# Patient Record
Sex: Female | Born: 1960 | Race: White | Hispanic: No | Marital: Married | Smoking: Never smoker
Health system: Southern US, Community
[De-identification: ages and names within clinical notes are randomized; demographics above are authoritative.]

---

## 1997-09-29 ENCOUNTER — Other Ambulatory Visit: Admission: RE | Admit: 1997-09-29 | Discharge: 1997-09-29 | Payer: Self-pay | Admitting: Obstetrics and Gynecology

## 1999-01-01 ENCOUNTER — Other Ambulatory Visit: Admission: RE | Admit: 1999-01-01 | Discharge: 1999-01-01 | Payer: Self-pay | Admitting: Obstetrics and Gynecology

## 1999-10-18 ENCOUNTER — Other Ambulatory Visit: Admission: RE | Admit: 1999-10-18 | Discharge: 1999-10-18 | Payer: Self-pay | Admitting: Obstetrics and Gynecology

## 1999-12-26 ENCOUNTER — Encounter (INDEPENDENT_AMBULATORY_CARE_PROVIDER_SITE_OTHER): Payer: Self-pay | Admitting: Specialist

## 1999-12-26 ENCOUNTER — Ambulatory Visit (HOSPITAL_COMMUNITY): Admission: RE | Admit: 1999-12-26 | Discharge: 1999-12-26 | Payer: Self-pay | Admitting: Obstetrics and Gynecology

## 2000-12-02 ENCOUNTER — Other Ambulatory Visit: Admission: RE | Admit: 2000-12-02 | Discharge: 2000-12-02 | Payer: Self-pay | Admitting: Obstetrics and Gynecology

## 2002-02-09 ENCOUNTER — Other Ambulatory Visit: Admission: RE | Admit: 2002-02-09 | Discharge: 2002-02-09 | Payer: Self-pay | Admitting: Obstetrics and Gynecology

## 2003-03-02 ENCOUNTER — Other Ambulatory Visit: Admission: RE | Admit: 2003-03-02 | Discharge: 2003-03-02 | Payer: Self-pay | Admitting: Obstetrics and Gynecology

## 2003-12-10 ENCOUNTER — Emergency Department (HOSPITAL_COMMUNITY): Admission: EM | Admit: 2003-12-10 | Discharge: 2003-12-10 | Payer: Self-pay | Admitting: *Deleted

## 2004-03-11 ENCOUNTER — Other Ambulatory Visit: Admission: RE | Admit: 2004-03-11 | Discharge: 2004-03-11 | Payer: Self-pay | Admitting: Obstetrics and Gynecology

## 2004-03-23 ENCOUNTER — Emergency Department (HOSPITAL_COMMUNITY): Admission: EM | Admit: 2004-03-23 | Discharge: 2004-03-23 | Payer: Self-pay | Admitting: Family Medicine

## 2005-03-26 ENCOUNTER — Other Ambulatory Visit: Admission: RE | Admit: 2005-03-26 | Discharge: 2005-03-26 | Payer: Self-pay | Admitting: Obstetrics and Gynecology

## 2007-08-19 ENCOUNTER — Emergency Department (HOSPITAL_COMMUNITY): Admission: EM | Admit: 2007-08-19 | Discharge: 2007-08-19 | Payer: Self-pay | Admitting: Emergency Medicine

## 2010-05-27 ENCOUNTER — Encounter
Admission: RE | Admit: 2010-05-27 | Discharge: 2010-05-27 | Payer: Self-pay | Source: Home / Self Care | Attending: Allergy | Admitting: Allergy

## 2010-10-04 NOTE — H&P (Signed)
[facility]  Patient:    Madison Duncan, Madison Duncan                        MRN: [id number] Adm. Date:  [id number] Attending:  Frederich Balding                         History and Physical  HISTORY OF PRESENT ILLNESS:  A 50 year old gravida 1, para 1, married white female, who presents for a hysteroscopy as well as diagnostic laparoscopy for evaluation of abnormal uterine bleeding and pelvic discomfort.  In relation to the present admission.  First of all, the patient has been on birth control pills in the form of Lo-Ovral.  She has described increasing pelvic pain and discomfort.  She describes as shooting mid-lower abdominal pain, which will last several seconds and occurs on a daily basis.  Associated with this is a more persistent right lower quadrant tenderness.  She is also having increasing problems with deep dyspareunia that has become extremely limiting. She has had three previous laparoscopies in the past.  The first two were done for a fertility evaluation with findings of endometriosis.  The last one we did was in 1998 with negative findings except for some minimal pelvic adhesive processes.  She is also having some intermittent bleeding difficulties on the birth control pills.  We have treated her with a course of antibiotics. Ultrasound evaluation has been negative.  ALLERGIES:  No known drug allergies.  MEDICATIONS:  Birth control pills and Effexor.  PAST MEDICAL HISTORY:  Usual childhood diseases.  She does have bilateral breast implants put in surgically, and she has had three previous diagnostic laparoscopies, one previous spontaneous vaginal delivery.  FAMILY HISTORY:  Noncontributory.  SOCIAL HISTORY:  No tobacco or alcohol use.  REVIEW OF SYSTEMS:  Noncontributory.  PHYSICAL EXAMINATION:  VITAL SIGNS:  The patient is afebrile with stable vital signs.  HEENT:  Normocephalic.  Pupils equal, round and reactive to light and accommodation.   Extraocular movements were intact.  Oropharynx clear.  NECK:  Without thyromegaly.  BREASTS:  Without any discrete masses.  LUNGS:  Clear.  CARDIOVASCULAR:  Regular rhythm and rate without murmurs or gallops.  ABDOMEN:  Benign.  PELVIC:  Normal external genitalia.  Vaginal mucosa clear.  Cervix unremarkable.  Uterus normal size, shape, and contour.  Adnexa free of any masses or tenderness.  EXTREMITIES:  Trace edema.  NEUROLOGIC:  Grossly within normal limits.  IMPRESSION:  Pelvic pain, abnormal bleeding.  Rule out pelvic pathology.  PLAN:  The patient will undergo a hysteroscopy along with diagnostic laparoscopy on standby.  The risks of surgery have been discussed, including the risks of anesthesia.  The risk of incisional infection or bleeding.  The risk of vascular injury that could require transfusion with associated risk of AIDS or hepatitis.  The risk of injury to adjacent organs that could require further exploratory surgery.  The risks of deep venous thrombosis or pulmonary embolus.  The patient expressed an understanding of the indications and risks. DD:  12/26/99 TD:  12/26/99 Job: [id number]

## 2010-10-04 NOTE — Op Note (Signed)
[facility]  Patient:    Madison Duncan, Madison Duncan                        MRN: [id number] Proc. Date: 12/26/99 Adm. Date:  [id number] Attending:  Frederich Balding                           Operative Report  PREOPERATIVE DIAGNOSIS:  Pelvic pain.  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  PROCEDURE:  Hysteroscopy, D&C, and open diagnostic laparoscopy.  SURGEON:  Juluis Mire, M.D.  ANESTHESIA:  General endotracheal.  ESTIMATED BLOOD LOSS:  Minimal.  PACKS AND DRAINS:  None.  INTRAOPERATIVE BLOOD PLACED:  None.  COMPLICATIONS:  None.  INDICATIONS:  Are dictated in History and Physical.  DESCRIPTION OF PROCEDURE:  The patient was taken to the OR and placed in the supine position.  After a satisfactory level of general endotracheal anesthesia was obtained, the patient was placed in the dorsal supine position using Allen stirrups.  The abdomen, perineum, and vagina were prepped out with Betadine.  Examination under anesthesia revealed the uterus to be anterior, normal size and shape.  Adnexae unremarkable.  There was marked uterine descensus.  The patient was draped out for hysteroscopy.  A speculum was placed in the vaginal vault.  The cervix was grasped with a single tooth tenaculum.  The uterine was sounded to approximately 8 cm.  The cervix was serially dilated to a size 31 Pratt dilator.  The operating hysteroscope was introduced.  It was noted that she did have a right _______ perforation.  The endometrial cavity was otherwise clear of polyps or fibroids.  Endometrial curettings were obtained and sent for pathologic review.  There was minimal bleeding encountered.  The Hulka tenaculum was then put in place and the single tooth tenaculum and speculum then removed.  The bladder was emptied by in and out catheterization.  Urine output was clear.  The patient was then made ready for  laparoscopy.  The subumbilical incision was made with the knife.  The incision was extended through the subcutaneous tissue.  The fascia was identified and entered sharply, and the incision of the fascia extended laterally.  The peritoneum was then identified and entered sharply.  Two lateral sutures of 0 Vicryl were put in place and held.  The Hassan cannula was put in place and secured with the held sutures.  The abdomen was insufflated with carbon dioxide.  The operating microscope was introduced. There was no evidence of injury to the adjacent organs.  There were no periumbilical adhesions.  A 5 mm trocar was then placed under direct visualization.  A left fundal perforation was noted.  There was no evidence of injury to adjacent organs.  Bipolar was brought into place to bring about hemostasis.  The right ovary was adherent to the pelvic side wall.  The left tube and ovary were unremarkable.  There was no evidence of active endometriosis or other pelvic pathology.  The appendix was visualized and was noted to be normal, and upper abdomen including the liver, and both lateral gutters were clear.  The perforation site with hemostasis and intact, and again no pelvic pathology was noted.  I felt possibly she may have uterine adenomyosis as an issue.  Irrigation was used and removed.  The abdomen was deflated of carbon dioxide and all trocars removed.  The subumbilical  fascia was closed with two figure-of-eights of 0 Vicryl.  The skin was closed with a running subcuticular 4-0 Vicryl.  The suprapubic incision was closed with Steri-Strips, and the Hulka tenaculum removed.  The patient was having minimal vaginal bleeding. The patient was taken out of the dorsal supine position, ________, extubated, and transferred to the recovery room in good condition.  Sponge and instrument count was correct by the circulating nurse. DD:  12/26/99 TD:  12/26/99 Job: [id number]

## 2010-10-17 ENCOUNTER — Other Ambulatory Visit: Payer: Self-pay | Admitting: Obstetrics and Gynecology

## 2010-10-17 DIAGNOSIS — N63 Unspecified lump in unspecified breast: Secondary | ICD-10-CM

## 2010-10-23 ENCOUNTER — Other Ambulatory Visit: Payer: Self-pay | Admitting: Obstetrics and Gynecology

## 2010-10-23 ENCOUNTER — Ambulatory Visit
Admission: RE | Admit: 2010-10-23 | Discharge: 2010-10-23 | Disposition: A | Discharge status: Home / Self Care | Payer: BC Managed Care – PPO | Source: Ambulatory Visit | Attending: Obstetrics and Gynecology | Admitting: Obstetrics and Gynecology

## 2010-10-23 DIAGNOSIS — N63 Unspecified lump in unspecified breast: Secondary | ICD-10-CM

## 2010-12-19 ENCOUNTER — Other Ambulatory Visit: Payer: Self-pay | Admitting: Obstetrics and Gynecology

## 2010-12-19 DIAGNOSIS — N63 Unspecified lump in unspecified breast: Secondary | ICD-10-CM

## 2010-12-26 ENCOUNTER — Other Ambulatory Visit: Payer: BC Managed Care – PPO

## 2010-12-30 ENCOUNTER — Ambulatory Visit
Admission: RE | Admit: 2010-12-30 | Discharge: 2010-12-30 | Disposition: A | Discharge status: Home / Self Care | Payer: BC Managed Care – PPO | Source: Ambulatory Visit | Attending: Obstetrics and Gynecology | Admitting: Obstetrics and Gynecology

## 2010-12-30 DIAGNOSIS — N63 Unspecified lump in unspecified breast: Secondary | ICD-10-CM

## 2012-03-09 DIAGNOSIS — L409 Psoriasis, unspecified: Secondary | ICD-10-CM | POA: Insufficient documentation

## 2013-08-15 DIAGNOSIS — F32A Depression, unspecified: Secondary | ICD-10-CM | POA: Insufficient documentation

## 2014-07-24 DIAGNOSIS — R0683 Snoring: Secondary | ICD-10-CM | POA: Insufficient documentation

## 2014-07-24 DIAGNOSIS — R5383 Other fatigue: Secondary | ICD-10-CM | POA: Insufficient documentation

## 2014-07-24 DIAGNOSIS — G4710 Hypersomnia, unspecified: Secondary | ICD-10-CM | POA: Insufficient documentation

## 2014-09-25 DIAGNOSIS — J309 Allergic rhinitis, unspecified: Secondary | ICD-10-CM | POA: Insufficient documentation

## 2014-09-25 DIAGNOSIS — G4733 Obstructive sleep apnea (adult) (pediatric): Secondary | ICD-10-CM | POA: Insufficient documentation

## 2015-12-03 DIAGNOSIS — M810 Age-related osteoporosis without current pathological fracture: Secondary | ICD-10-CM | POA: Insufficient documentation

## 2016-08-21 ENCOUNTER — Institutional Professional Consult (permissible substitution): Payer: Self-pay | Admitting: Pulmonary Disease

## 2016-09-08 ENCOUNTER — Institutional Professional Consult (permissible substitution): Payer: Self-pay | Admitting: Internal Medicine

## 2016-09-14 ENCOUNTER — Emergency Department (HOSPITAL_COMMUNITY): Payer: No Typology Code available for payment source

## 2016-09-14 ENCOUNTER — Encounter (HOSPITAL_COMMUNITY): Payer: Self-pay | Admitting: *Deleted

## 2016-09-14 ENCOUNTER — Emergency Department (HOSPITAL_COMMUNITY)
Admission: EM | Admit: 2016-09-14 | Discharge: 2016-09-14 | Disposition: A | Discharge status: Home / Self Care | Payer: No Typology Code available for payment source | Attending: Emergency Medicine | Admitting: Emergency Medicine

## 2016-09-14 DIAGNOSIS — Y929 Unspecified place or not applicable: Secondary | ICD-10-CM | POA: Diagnosis not present

## 2016-09-14 DIAGNOSIS — Y9341 Activity, dancing: Secondary | ICD-10-CM | POA: Insufficient documentation

## 2016-09-14 DIAGNOSIS — Y999 Unspecified external cause status: Secondary | ICD-10-CM | POA: Diagnosis not present

## 2016-09-14 DIAGNOSIS — S4992XA Unspecified injury of left shoulder and upper arm, initial encounter: Secondary | ICD-10-CM | POA: Diagnosis present

## 2016-09-14 DIAGNOSIS — S42212A Unspecified displaced fracture of surgical neck of left humerus, initial encounter for closed fracture: Secondary | ICD-10-CM | POA: Diagnosis not present

## 2016-09-14 DIAGNOSIS — W1839XA Other fall on same level, initial encounter: Secondary | ICD-10-CM | POA: Insufficient documentation

## 2016-09-14 DIAGNOSIS — S42202A Unspecified fracture of upper end of left humerus, initial encounter for closed fracture: Secondary | ICD-10-CM

## 2016-09-14 MED ORDER — OXYCODONE-ACETAMINOPHEN 5-325 MG PO TABS
1.0000 | ORAL_TABLET | Freq: Once | ORAL | Status: AC
  Administered 2016-09-14: 1 via ORAL
  Filled 2016-09-14: qty 1

## 2016-09-14 MED ORDER — METHOCARBAMOL 500 MG PO TABS: 500.0000 mg | ORAL_TABLET | Freq: Two times a day (BID) | ORAL | 0 refills | Status: DC

## 2016-09-14 MED ORDER — OXYCODONE-ACETAMINOPHEN 5-325 MG PO TABS: 1.0000 | ORAL_TABLET | ORAL | 0 refills | Status: DC | PRN

## 2016-09-14 NOTE — ED Triage Notes (Signed)
PT reports falling last night while dancing . Pt reports having pain in LT elbow ,upper arm and Lt shoulder. Pt unable to raise arm above her head on arrival to ED.

## 2016-09-14 NOTE — ED Provider Notes (Signed)
[facility]-EMERGENCY DEPT Provider Note   CSN: [id number] Arrival date & time: 09/14/16  4098     History   Chief Complaint Chief Complaint  Patient presents with  . Shoulder Pain    HPI Madison Duncan is a 56 y.o. female.  56 year old female presents with left upper extremity pain occurred after she fell last night. Denied any head injury or neck injury. Pain is sharp and worse with movement and localized to her proximal upper arm. Denies any numbness or tingling to her left hand. Pain better with remaining still. States her left mobility is limited by pain. No treatment use prior to arrival.      History reviewed. No pertinent past medical history.  There are no active problems to display for this patient.   History reviewed. No pertinent surgical history.  OB History    No data available       Home Medications    Prior to Admission medications   Not on File    Family History History reviewed. No pertinent family history.  Social History Social History  Substance Use Topics  . Smoking status: Never Smoker  . Smokeless tobacco: Never Used  . Alcohol use 0.6 oz/week    1 Glasses of wine per week     Allergies   Patient has no known allergies.   Review of Systems Review of Systems  All other systems reviewed and are negative.    Physical Exam Updated Vital Signs BP (!) 106/59 (BP Location: Right Arm)   Pulse 77   Temp 98 F (36.7 C) (Oral)   Resp 16   Ht  (1.575 m)   Wt 51.3 kg   SpO2 98%   BMI 20.67 kg/m   Physical Exam  Constitutional: She is oriented to person, place, and time. She appears well-developed and well-nourished.  Non-toxic appearance. No distress.  HENT:  Head: Normocephalic and atraumatic.  Eyes: Conjunctivae, EOM and lids are normal. Pupils are equal, round, and reactive to light.  Neck: Normal range of motion. Neck supple. No tracheal deviation present. No thyroid mass present.  Cardiovascular: Normal rate, regular  rhythm and normal heart sounds.  Exam reveals no gallop.   No murmur heard. Pulmonary/Chest: Effort normal and breath sounds normal. No stridor. No respiratory distress. She has no decreased breath sounds. She has no wheezes. She has no rhonchi. She has no rales.  Abdominal: Soft. Normal appearance and bowel sounds are normal. She exhibits no distension. There is no tenderness. There is no rebound and no CVA tenderness.  Musculoskeletal: Normal range of motion. She exhibits no edema or tenderness.       Arms: Neurological: She is alert and oriented to person, place, and time. She has normal strength. No cranial nerve deficit or sensory deficit. GCS eye subscore is 4. GCS verbal subscore is 5. GCS motor subscore is 6.  Skin: Skin is warm and dry. No abrasion and no rash noted.  Psychiatric: She has a normal mood and affect. Her speech is normal and behavior is normal.  Nursing note and vitals reviewed.    ED Treatments / Results  Labs (all labs ordered are listed, but only abnormal results are displayed) Labs Reviewed - No data to display  EKG  EKG Interpretation None       Radiology No results found.  Procedures Procedures (including critical care time)  Medications Ordered in ED Medications  oxyCODONE-acetaminophen (PERCOCET/ROXICET) 5-325 MG per tablet 1 tablet (not administered)     Initial  Impression / Assessment and Plan / ED Course  I have reviewed the triage vital signs and the nursing notes.  Pertinent labs & imaging results that were available during my care of the patient were reviewed by me and considered in my medical decision making (see chart for details).     Patient medicated for pain here and x-rays shows a proximal humerus fracture. Will be placed in a sling and given referral to orthopedic's on call.  Final Clinical Impressions(s) / ED Diagnoses   Final diagnoses:  None    New Prescriptions New Prescriptions   No medications on file       Lorre Nick, MD 09/14/16 [phone]

## 2016-09-14 NOTE — ED Notes (Signed)
Declined W/C at D/C and was escorted to lobby by RN. 

## 2016-09-14 NOTE — ED Triage Notes (Signed)
2 rings removed and placed  In clear speciume cup .  Cup given to Janeece Riggers ,husband.

## 2016-09-16 DIAGNOSIS — S42202A Unspecified fracture of upper end of left humerus, initial encounter for closed fracture: Secondary | ICD-10-CM | POA: Insufficient documentation

## 2016-12-31 ENCOUNTER — Institutional Professional Consult (permissible substitution): Payer: No Typology Code available for payment source | Admitting: Internal Medicine

## 2017-01-07 ENCOUNTER — Telehealth: Payer: Self-pay | Admitting: Internal Medicine

## 2017-01-07 NOTE — Telephone Encounter (Signed)
Called office, was made aware that referrals were handled by an outside party- was told to contact Molly Maduro at [phone].  Called to make aware that pt has cx'ed consults X3 and will not be rescheduled.  Will close encounter.

## 2017-06-29 ENCOUNTER — Other Ambulatory Visit: Payer: Self-pay | Admitting: Gastroenterology

## 2017-06-29 DIAGNOSIS — R1031 Right lower quadrant pain: Secondary | ICD-10-CM

## 2017-07-13 ENCOUNTER — Other Ambulatory Visit: Payer: No Typology Code available for payment source

## 2017-12-07 ENCOUNTER — Encounter: Payer: Self-pay | Admitting: Hematology

## 2017-12-07 ENCOUNTER — Telehealth: Payer: Self-pay | Admitting: Hematology

## 2017-12-07 NOTE — Telephone Encounter (Signed)
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 Pt has been scheduled to see Dr. Candise CheKale on 8/12 at 1pm. Pt aware to arrive 30 minutes early. Letter mailed.

## 2017-12-25 NOTE — Progress Notes (Signed)
HEMATOLOGY/ONCOLOGY CONSULTATION NOTE  Date of Service: 12/28/2017  Patient Care Team: Lucila MaineSpencer, Sara C, PA-C as PCP - General (Physician Assistant)  CHIEF COMPLAINTS/PURPOSE OF CONSULTATION:  IgA Deficiency  HISTORY OF PRESENTING ILLNESS:   Madison Duncan is a wonderful 57 y.o. female who has been referred to us by Dr. 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 She is accompanied today by her husband. The pt reports that she is doing well overall.    The pt developed diarrhea 6 months ago that was associated with terrible pain that did not present in the same location. The pt notes that her stools are completely watery and moves her bowels 2-4 times a day. She notes that she feels best when she is "empty," but has significant lower-abdominal pain for the hours preceding a bowel movement. The pt tried metamucil for a few months and began [place]Boniva in April. She has seen Dr. Kerin SalenArya Karki in GI for these symptoms, and had a colonoscopy previously which revealed diverticulosis. The pt is unsure if there is a plan for an endoscopy. Overall, the pt notes that her diarrhea has become more severe. She denies any exotic travel or concern for consumption of unclean water. She denies swimming recently, or being in bodies of water besides the ocean over the summer. The pt denies associating her symptoms with her diet, or anything in particular. She notes that she has diarrhea every day. She denies new swollen/painful joints or concerns for autoimmune processes. She also denies having recurrent sinus infections. She has had stable weight overall.   The pt notes that she had psoriasis when she was 11 and was treated with topical steroids, and this has not been a concern for the last 10 years.   She notes that she has not had a concern with frequent infections. She denies recent steroid use.   The pt also takes Celexa, which she has taken for many years.   Most recent lab results (11/10/17) of  Celiac Ab Profile is as follows: all values are WNL except for IgA, Qn, serum at <5.  On review of systems, pt reports chronic diarrhea with lower abdominal pains, stable weight, and denies blood in the stools, fevers, chills, recurrent sinus infections, mucous in the stools, mouth sores, painful/swollen joints, changes in urination, skin rashes, leg swelling, and any other symptoms.   On PMHx the pt reports Psoriasis, Hypersomnia, OSA on CPAP, age related osteoporosis, Diverticulosis, three laparoscopies 24 years ago for endometriosis, and denies autoimmune processes. 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  On Family Hx the pt reports sister with Colon cancer at age 59 and denies blood disorders or immune problems.   MEDICAL HISTORY:  Psoriasis, Hypersomnia, OSA on CPAP, age related osteoporosis, Diverticulosis, three laparoscopies 24 years ago for endometriosis,   SURGICAL HISTORY: three laparoscopies 24 years ago for endometriosis  SOCIAL HISTORY: Social History   Socioeconomic History  . Marital status: Married    Spouse name: Not on file  . Number of children: Not on file  . Years of education: Not on file  . Highest education level: Not on file  Occupational History  . Not on file  Social Needs  . Financial resource strain: Not on file  . Food insecurity:    Worry: Not on file    Inability: Not on file  . Transportation needs:    Medical:  Not on file    Non-medical: Not on file  Tobacco Use  . Smoking status: Never Smoker  . Smokeless tobacco: Never Used  Substance and Sexual Activity  . Alcohol use: Yes    Alcohol/week: 1.0 standard drinks    Types: 1 Glasses of wine per week  . Drug use: No  . Sexual activity: Not on file  Lifestyle  . Physical activity:    Days per week: Not on file    Minutes per session: Not on file  . Stress: Not on file  Relationships  . Social  connections:    Talks on phone: Not on file    Gets together: Not on file    Attends religious service: Not on file    Active member of club or organization: Not on file    Attends meetings of clubs or organizations: Not on file    Relationship status: Not on file  . Intimate partner violence:    Fear of current or ex partner: Not on file    Emotionally abused: Not on file    Physically abused: Not on file    Forced sexual activity: Not on file  Other Topics Concern  . Not on file  Social History Narrative  . Not on file  dental hygienist, consumes 3 glasses of wine each day, smoked cigarettes in high school.   FAMILY HISTORY: sister with Colon cancer at age 73 and denies blood disorders or immune problems.   ALLERGIES:  has No Known Allergies.  MEDICATIONS:  Current Outpatient Medications  Medication Sig Dispense Refill  . citalopram (CELEXA) 40 MG tablet Take 40 mg by mouth at bedtime.     No current facility-administered medications for this visit.     REVIEW OF SYSTEMS:    10 Point review of Systems was done is negative except as noted above.  PHYSICAL EXAMINATION: . Vitals:   12/28/17 1246  BP: 108/60  Pulse: 84  Resp: 18  Temp: 98.5 F (36.9 C)  SpO2: 97%   Filed Weights   12/28/17 1246  Weight: 114 lb 11.2 oz (52 kg)   .Body mass index is 20.98 kg/m.  GENERAL:alert, in no acute distress and comfortable SKIN: no acute rashes, no significant lesions EYES: conjunctiva are pink and non-injected, sclera anicteric OROPHARYNX: MMM, no exudates, no oropharyngeal erythema or ulceration NECK: supple, no JVD LYMPH:  no palpable lymphadenopathy in the cervical, axillary or inguinal regions LUNGS: clear to auscultation b/l with normal respiratory effort HEART: regular rate & rhythm ABDOMEN:  normoactive bowel sounds , non tender, not distended. Extremity: no pedal edema PSYCH: alert & oriented x 3 with fluent speech NEURO: no focal motor/sensory  deficits  LABORATORY DATA:  I have reviewed the data as listed  . CBC Latest Ref Rng & Units 12/28/2017  WBC 3.9 - 10.3 K/uL 9.1  Hemoglobin 11.6 - 15.9 g/dL 16.1  Hematocrit 09.6 - 46.6 % 43.4  Platelets 145 - 400 K/uL 233    . CMP Latest Ref Rng & Units 12/28/2017  Glucose 70 - 99 mg/dL 84  BUN 6 - 20 mg/dL 12  Creatinine 0.45 - 4.09 mg/dL 8.11  Sodium 914 - 782 mmol/L 139  Potassium 3.5 - 5.1 mmol/L 3.4(L)  Chloride 98 - 111 mmol/L 103  CO2 22 - 32 mmol/L 24  Calcium 8.9 - 10.3 mg/dL 9.2  Total Protein 6.5 - 8.1 g/dL 7.7  Total Bilirubin 0.3 - 1.2 mg/dL 0.5  Alkaline Phos 38 - 126 U/L 88  AST 15 - 41 U/L 19  ALT 0 - 44 U/L 13   Component     Latest Ref Rng & Units 12/28/2017  LDH     98 - 192 U/L 216 (H)  CRP     <1.0 mg/dL <6.0  Sed Rate     0 - 22 mm/hr 5        11/10/17 Celiac Profile:    RADIOGRAPHIC STUDIES: I have personally reviewed the radiological images as listed and agreed with the findings in the report. No results found.  ASSESSMENT & PLAN:   57 y.o. female with  1. 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  -Discussed with the pt and her husband that it is unlikely that her low immunoglobulin A by itself explain her symptoms -Recommend that GI continue to work up the patient's abdominal pain and chronic diarrhea. IBS or overt pathology? Recommend evaluation with CT A/P -Will order blood tests today to evaluate immunoglobulins - reviewed -- isolated IgA deficiency. -she denies a phenotype suggestive of recurrent sinopulmonary or GI infections. -discussed increased risk of infection, association with autoimmune pathology, GI infections, celiac disease, IBD , possible of anaphylaxis with blood transfusions if she has anti IgA Ab. -Will order stool study today - GI panel.   Labs today RTC  with Dr Candise Che in 2 weeks with labs   All of the patients questions were answered with apparent satisfaction. The patient knows to call the clinic with any problems, questions or concerns.  The total time spent in the appt was 50 minutes and more than 50% was on counseling and direct patient cares.    Wyvonnia Lora MD MS AAHIVMS [facility] Hematology/Oncology Physician [facility]  (Office):       [phone] (Work cell):  [phone] (Fax):           [phone]  12/28/2017 1:58 PM  I, Marcelline Mates, am acting as a scribe for Dr. Candise Che  .I have reviewed the above documentation for accuracy and completeness, and I agree with the above. Johney Maine MD

## 2017-12-28 ENCOUNTER — Inpatient Hospital Stay: Payer: No Typology Code available for payment source | Attending: Hematology | Admitting: Hematology

## 2017-12-28 ENCOUNTER — Telehealth: Payer: Self-pay

## 2017-12-28 ENCOUNTER — Inpatient Hospital Stay: Payer: No Typology Code available for payment source

## 2017-12-28 ENCOUNTER — Encounter: Payer: Self-pay | Admitting: Hematology

## 2017-12-28 VITALS — BP 108/60 | HR 84 | Temp 98.5°F | Resp 18 | Ht 62.0 in | Wt 114.7 lb

## 2017-12-28 DIAGNOSIS — F17210 Nicotine dependence, cigarettes, uncomplicated: Secondary | ICD-10-CM | POA: Insufficient documentation

## 2017-12-28 DIAGNOSIS — L409 Psoriasis, unspecified: Secondary | ICD-10-CM | POA: Diagnosis not present

## 2017-12-28 DIAGNOSIS — R197 Diarrhea, unspecified: Secondary | ICD-10-CM | POA: Insufficient documentation

## 2017-12-28 DIAGNOSIS — G4733 Obstructive sleep apnea (adult) (pediatric): Secondary | ICD-10-CM | POA: Diagnosis not present

## 2017-12-28 DIAGNOSIS — D72820 Lymphocytosis (symptomatic): Secondary | ICD-10-CM | POA: Insufficient documentation

## 2017-12-28 DIAGNOSIS — D802 Selective deficiency of immunoglobulin A [IgA]: Secondary | ICD-10-CM

## 2017-12-28 DIAGNOSIS — Z79899 Other long term (current) drug therapy: Secondary | ICD-10-CM | POA: Diagnosis not present

## 2017-12-28 LAB — CBC WITH DIFFERENTIAL/PLATELET
Basophils Absolute: 0.1 10*3/uL (ref 0.0–0.1)
Basophils Relative: 1 %
Eosinophils Absolute: 0.2 10*3/uL (ref 0.0–0.5)
Eosinophils Relative: 2 %
HCT: 43.4 % (ref 34.8–46.6)
HEMOGLOBIN: 14.3 g/dL (ref 11.6–15.9)
LYMPHS PCT: 47 %
Lymphs Abs: 4.3 10*3/uL — ABNORMAL HIGH (ref 0.9–3.3)
MCH: 31.5 pg (ref 25.1–34.0)
MCHC: 32.9 g/dL (ref 31.5–36.0)
MCV: 95.6 fL (ref 79.5–101.0)
MONO ABS: 0.6 10*3/uL (ref 0.1–0.9)
MONOS PCT: 6 %
NEUTROS ABS: 4 10*3/uL (ref 1.5–6.5)
NEUTROS PCT: 44 %
Platelets: 233 10*3/uL (ref 145–400)
RBC: 4.54 MIL/uL (ref 3.70–5.45)
RDW: 12.3 % (ref 11.2–14.5)
WBC: 9.1 10*3/uL (ref 3.9–10.3)

## 2017-12-28 LAB — CMP (CANCER CENTER ONLY)
ALBUMIN: 4.6 g/dL (ref 3.5–5.0)
ALT: 13 U/L (ref 0–44)
ANION GAP: 12 (ref 5–15)
AST: 19 U/L (ref 15–41)
Alkaline Phosphatase: 88 U/L (ref 38–126)
BUN: 12 mg/dL (ref 6–20)
CO2: 24 mmol/L (ref 22–32)
Calcium: 9.2 mg/dL (ref 8.9–10.3)
Chloride: 103 mmol/L (ref 98–111)
Creatinine: 0.77 mg/dL (ref 0.44–1.00)
GFR, Est AFR Am: >60 mL/min (ref >60)
GFR, Estimated: >60 mL/min (ref >60)
GLUCOSE: 84 mg/dL (ref 70–99)
POTASSIUM: 3.4 mmol/L — AB (ref 3.5–5.1)
Sodium: 139 mmol/L (ref 135–145)
Total Bilirubin: 0.5 mg/dL (ref 0.3–1.2)
Total Protein: 7.7 g/dL (ref 6.5–8.1)

## 2017-12-28 LAB — LACTATE DEHYDROGENASE: LDH: 216 U/L — ABNORMAL HIGH (ref 98–192)

## 2017-12-28 LAB — C-REACTIVE PROTEIN: CRP: <0.8 mg/dL (ref <1.0)

## 2017-12-28 LAB — SEDIMENTATION RATE: Sed Rate: 5 mm/hr (ref 0–22)

## 2017-12-28 NOTE — Telephone Encounter (Signed)
Explain to patient that this was the next avalable appointment. Per 8/12 los Printed avs and calender of upcoming appointment will check and see if it could be schedule at a different time.  OFF WORK ON - Monday, and Friday's AFTER WORK ON - Tuesday-Thurday

## 2017-12-29 LAB — MULTIPLE MYELOMA PANEL, SERUM
ALPHA2 GLOB SERPL ELPH-MCNC: 0.8 g/dL (ref 0.4–1.0)
Albumin SerPl Elph-Mcnc: 4.2 g/dL (ref 2.9–4.4)
Albumin/Glob SerPl: 1.5 (ref 0.7–1.7)
Alpha 1: 0.3 g/dL (ref 0.0–0.4)
B-GLOBULIN SERPL ELPH-MCNC: 1 g/dL (ref 0.7–1.3)
GAMMA GLOB SERPL ELPH-MCNC: 0.9 g/dL (ref 0.4–1.8)
GLOBULIN, TOTAL: 3 g/dL (ref 2.2–3.9)
IGG (IMMUNOGLOBIN G), SERUM: 803 mg/dL (ref 700–1600)
IgA: <5 mg/dL — ABNORMAL LOW (ref 87–352)
IgM (Immunoglobulin M), Srm: 206 mg/dL (ref 26–217)
TOTAL PROTEIN ELP: 7.2 g/dL (ref 6.0–8.5)

## 2017-12-29 LAB — KAPPA/LAMBDA LIGHT CHAINS
KAPPA FREE LGHT CHN: 10.5 mg/L (ref 3.3–19.4)
Kappa, lambda light chain ratio: 0.46 (ref 0.26–1.65)
Lambda free light chains: 23 mg/L (ref 5.7–26.3)

## 2018-01-12 NOTE — Progress Notes (Signed)
HEMATOLOGY/ONCOLOGY CLINIC NOTE  Date of Service: 01/13/2018  Patient Care Team: Lucila Maine as PCP - General (Physician Assistant)  CHIEF COMPLAINTS/PURPOSE OF CONSULTATION:  IgA Deficiency  HISTORY OF PRESENTING ILLNESS:   Madison Duncan is a wonderful 57 y.o. female who has been referred to [place] by Dr. 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 She is accompanied today by her husband. The pt reports that she is doing well overall.    The pt developed diarrhea 6 months ago that was associated with terrible pain that did not present in the same location. The pt notes that her stools are completely watery and moves her bowels 2-4 times a day. She notes that she feels best when she is "empty," but has significant lower-abdominal pain for the hours preceding a bowel movement. The pt tried metamucil for a few months and began [place] in April. She has seen Dr. Kerin Salen in GI for these symptoms, and had a colonoscopy previously which revealed diverticulosis. The pt is unsure if there is a plan for an endoscopy. Overall, the pt notes that her diarrhea has become more severe. She denies any exotic travel or concern for consumption of unclean water. She denies swimming recently, or being in bodies of water besides the ocean over the summer. The pt denies associating her symptoms with her diet, or anything in particular. She notes that she has diarrhea every day. She denies new swollen/painful joints or concerns for autoimmune processes. She also denies having recurrent sinus infections. She has had stable weight overall.   The pt notes that she had psoriasis when she was 11 and was treated with topical steroids, and this has not been a concern for the last 10 years.   She notes that she has not had a concern with frequent infections. She denies recent steroid use.   The pt also takes Celexa, which she has taken for many years.   Most recent lab results (11/10/17) of Celiac  Ab Profile is as follows: all values are WNL except for IgA, Qn, serum at <5.  On review of systems, pt reports chronic diarrhea with lower abdominal pains, stable weight, and denies blood in the stools, fevers, chills, recurrent sinus infections, mucous in the stools, mouth sores, painful/swollen joints, changes in urination, skin rashes, leg swelling, and any other symptoms.   On PMHx the pt reports Psoriasis, Hypersomnia, OSA on CPAP, age related osteoporosis, Diverticulosis, three laparoscopies 24 years ago for endometriosis, and denies autoimmune processes. 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  On Family Hx the pt reports sister with Colon cancer at age 41 and denies blood disorders or immune problems.   Interval History:   Madison Duncan returns today for management and evaluation of her IgA deficiency. The patient's last visit with [place] was on 12/28/17. The pt reports that she is doing well overall.   The pt reports that a few days ago her abdominal pain returned more severely, accompanied with diarrhea with proceeds every day. She also notes that she felt nauseous and felt that she would vomit, but didn't, and notes that this is a new symptoms occurrence in her 6 months of daily diarrhea with every few days severe cramping. The pt notes that her abdominal cramping does not recur in the same position each time, and has tried to induce emesis for relief without success.  The pt notes that she will be calling her GI Dr. Kerin Salen today after she leaves our appointment.   The pt picked up a stool study container today and will complete this step of the workup. The pt denies swimming in any pools or fresh water sources recently. She denies concern for exposure to unsafe water sources.   Lab results (12/28/17) of CBC w/diff, CMP is as follows: all values are WNL except for Lymphs abs at 4.3k, Potassium at 3.4. LDH  12/28/17 was at 216 12/28/17 MMP showed all values WNL except for IgA at <5 12/28/17 SFLC, Sed Rate, and CRP all showed values WNL  On review of systems, pt reports daily diarrhea, abdominal cramping intermittently, recent nausea, and denies vomiting, and any other symptoms.    MEDICAL HISTORY:  Psoriasis, Hypersomnia, OSA on CPAP, age related osteoporosis, Diverticulosis, three laparoscopies 24 years ago for endometriosis,   SURGICAL HISTORY: three laparoscopies 24 years ago for endometriosis  SOCIAL HISTORY: Social History   Socioeconomic History  . Marital status: Married    Spouse name: Not on file  . Number of children: Not on file  . Years of education: Not on file  . Highest education level: Not on file  Occupational History  . Not on file  Social Needs  . Financial resource strain: Not on file  . Food insecurity:    Worry: Not on file    Inability: Not on file  . Transportation needs:    Medical: Not on file    Non-medical: Not on file  Tobacco Use  . Smoking status: Never Smoker  . Smokeless tobacco: Never Used  Substance and Sexual Activity  . Alcohol use: Yes    Alcohol/week: 1.0 standard drinks    Types: 1 Glasses of wine per week  . Drug use: No  . Sexual activity: Not on file  Lifestyle  . Physical activity:    Days per week: Not on file    Minutes per session: Not on file  . Stress: Not on file  Relationships  . Social connections:    Talks on phone: Not on file    Gets together: Not on file    Attends religious service: Not on file    Active member of club or organization: Not on file    Attends meetings of clubs or organizations: Not on file    Relationship status: Not on file  . Intimate partner violence:    Fear of current or ex partner: Not on file    Emotionally abused: Not on file    Physically abused: Not on file    Forced sexual activity: Not on file  Other Topics Concern  . Not on file  Social History Narrative  . 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   FAMILY HISTORY: sister with Colon cancer at age 101 and denies blood disorders or immune problems.   ALLERGIES:  has No Known Allergies.  MEDICATIONS:  Current Outpatient Medications  Medication Sig Dispense Refill  . citalopram (CELEXA) 40 MG tablet Take 40 mg by mouth at bedtime.     No current facility-administered medications for this visit.     REVIEW OF SYSTEMS:    A 10+ POINT REVIEW OF SYSTEMS WAS OBTAINED including neurology, dermatology, psychiatry, cardiac, respiratory, lymph, extremities, GI, GU, Musculoskeletal, constitutional, breasts, reproductive, HEENT.  All pertinent positives are noted in the HPI.  All others are negative.  PHYSICAL EXAMINATION: . Vitals:   01/13/18 0925  BP: (!) 99/58  Pulse: 66  Resp: 18  Temp: 98.1 F (36.7 C)  SpO2: 100%   Filed Weights   01/13/18 0925  Weight: 112 lb 12.8 oz (51.2 kg)   .Body mass index is 20.63 kg/m.  GENERAL:alert, in no acute distress and comfortable SKIN: no acute rashes, no significant lesions EYES: conjunctiva are pink and non-injected, sclera anicteric OROPHARYNX: MMM, no exudates, no oropharyngeal erythema or ulceration NECK: supple, no JVD LYMPH:  no palpable lymphadenopathy in the cervical, axillary or inguinal regions LUNGS: clear to auscultation b/l with normal respiratory effort HEART: regular rate & rhythm ABDOMEN:  normoactive bowel sounds , non tender, not distended. No palpable hepatosplenomegaly.  Extremity: no pedal edema PSYCH: alert & oriented x 3 with fluent speech NEURO: no focal motor/sensory deficits   LABORATORY DATA:  I have reviewed the data as listed  . CBC Latest Ref Rng & Units 12/28/2017  WBC 3.9 - 10.3 K/uL 9.1  Hemoglobin 11.6 - 15.9 g/dL 16.114.3  Hematocrit 09.634.8 - 46.6 % 43.4  Platelets 145 - 400 K/uL 233    . CMP Latest Ref Rng & Units 12/28/2017  Glucose 70 - 99 mg/dL 84   BUN 6 - 20 mg/dL 12  Creatinine 0.450.44 - 4.091.00 mg/dL 8.110.77  Sodium 914135 - 782145 mmol/L 139  Potassium 3.5 - 5.1 mmol/L 3.4(L)  Chloride 98 - 111 mmol/L 103  CO2 22 - 32 mmol/L 24  Calcium 8.9 - 10.3 mg/dL 9.2  Total Protein 6.5 - 8.1 g/dL 7.7  Total Bilirubin 0.3 - 1.2 mg/dL 0.5  Alkaline Phos 38 - 126 U/L 88  AST 15 - 41 U/L 19  ALT 0 - 44 U/L 13   Component     Latest Ref Rng & Units 12/28/2017  LDH     98 - 192 U/L 216 (H)  CRP     <1.0 mg/dL <9.5<0.8  Sed Rate     0 - 22 mm/hr 5        11/10/17 Celiac Profile:    RADIOGRAPHIC STUDIES: I have personally reviewed the radiological images as listed and agreed with the findings in the report. No results found.  ASSESSMENT & PLAN:   57 y.o. female with  1. IgA Deficiency ?isolated IgA def  PLAN -Labs on presentation from 11/10/17, Celiac Ab profile revealed serum IgA at 5mg /dL.  -Discussed with the pt and her husband that it is unlikely that her low immunoglobulin A by itself explain her symptoms -Recommend that GI continue to work up the patient's abdominal pain and chronic diarrhea. IBS or overt pathology? Recommend evaluation with CT A/P -she denies a phenotype suggestive of recurrent sinopulmonary or GI infections. -discussed increased risk of infection, association with autoimmune pathology, GI infections, celiac disease, IBD , possible of anaphylaxis with blood transfusions if she has anti IgA Ab. -Discussed pt labwork from, 12/28/17; blood counts normal except some lymphocytosis at 4.3k, potassium borderline low at 3.4. LDH was slightly elevated at 216. IgA <5 with no other immunoglobulin insufficiencies. Normal Sed Rate, CRP  -Provided supplemental information -Recommended annual flu shot, and both pneumonia vaccinations every 5 years in the setting of IgA supression -Discussed that if anti-IgA antibodies are present, this could produce an anaphylactic reaction if she got a blood transfusion and to let future caregivers  know that she is IgA deficient -Discussed that elevated lymphocytes could be reactive to her chronic GI symptoms but we will collect a  Flow cytometry today to rule out a clonal process -Continue with stool study today -If a concern for frequent infections develops, would need further work up, likely with an Proofreader or with Infectious disease  -Endoscopy with duodenal biopsy could be called for -Continue follow up with Dr. Kerin Salen in GI -Will be happy to se this pt back as needed and at request   Labs today RTC with Dr Candise Che on an as needed basis    All of the patients questions were answered with apparent satisfaction. The patient knows to call the clinic with any problems, questions or concerns.  The total time spent in the appt was 30 minutes and more than 50% was on counseling and direct patient cares.     Wyvonnia Lora MD MS AAHIVMS [facility] Hematology/Oncology Physician [facility]  (Office):       [phone] (Work cell):  [phone] (Fax):           [phone]  01/13/2018 10:14 AM  I, Marcelline Mates, am acting as a scribe for Dr. Candise Che  .I have reviewed the above documentation for accuracy and completeness, and I agree with the above. Johney Maine MD

## 2018-01-13 ENCOUNTER — Encounter: Payer: Self-pay | Admitting: Hematology

## 2018-01-13 ENCOUNTER — Inpatient Hospital Stay: Payer: No Typology Code available for payment source

## 2018-01-13 ENCOUNTER — Telehealth: Payer: Self-pay | Admitting: Hematology

## 2018-01-13 ENCOUNTER — Inpatient Hospital Stay (HOSPITAL_BASED_OUTPATIENT_CLINIC_OR_DEPARTMENT_OTHER): Payer: No Typology Code available for payment source | Admitting: Hematology

## 2018-01-13 VITALS — BP 99/58 | HR 66 | Temp 98.1°F | Resp 18 | Ht 62.0 in | Wt 112.8 lb

## 2018-01-13 DIAGNOSIS — R197 Diarrhea, unspecified: Secondary | ICD-10-CM | POA: Diagnosis not present

## 2018-01-13 DIAGNOSIS — D802 Selective deficiency of immunoglobulin A [IgA]: Secondary | ICD-10-CM | POA: Diagnosis not present

## 2018-01-13 DIAGNOSIS — D72820 Lymphocytosis (symptomatic): Secondary | ICD-10-CM

## 2018-01-13 DIAGNOSIS — F17210 Nicotine dependence, cigarettes, uncomplicated: Secondary | ICD-10-CM | POA: Diagnosis not present

## 2018-01-13 DIAGNOSIS — Z79899 Other long term (current) drug therapy: Secondary | ICD-10-CM

## 2018-01-13 LAB — GASTROINTESTINAL PANEL BY PCR, STOOL (REPLACES STOOL CULTURE)
ADENOVIRUS F40/41: NOT DETECTED
Astrovirus: NOT DETECTED
CRYPTOSPORIDIUM: NOT DETECTED
CYCLOSPORA CAYETANENSIS: NOT DETECTED
Campylobacter species: NOT DETECTED
ENTAMOEBA HISTOLYTICA: NOT DETECTED
Enteroaggregative E coli (EAEC): NOT DETECTED
Enteropathogenic E coli (EPEC): NOT DETECTED
Enterotoxigenic E coli (ETEC): NOT DETECTED
Giardia lamblia: NOT DETECTED
Norovirus GI/GII: NOT DETECTED
PLESIMONAS SHIGELLOIDES: NOT DETECTED
Rotavirus A: NOT DETECTED
SAPOVIRUS (I, II, IV, AND V): NOT DETECTED
SHIGA LIKE TOXIN PRODUCING E COLI (STEC): NOT DETECTED
Salmonella species: NOT DETECTED
Shigella/Enteroinvasive E coli (EIEC): NOT DETECTED
VIBRIO CHOLERAE: NOT DETECTED
VIBRIO SPECIES: NOT DETECTED
YERSINIA ENTEROCOLITICA: NOT DETECTED

## 2018-01-13 LAB — CBC WITH DIFFERENTIAL/PLATELET
Basophils Absolute: 0.1 10*3/uL (ref 0.0–0.1)
Basophils Relative: 1 %
Eosinophils Absolute: 0.1 10*3/uL (ref 0.0–0.5)
Eosinophils Relative: 2 %
HEMATOCRIT: 41.7 % (ref 34.8–46.6)
HEMOGLOBIN: 13.9 g/dL (ref 11.6–15.9)
LYMPHS ABS: 3.4 10*3/uL — AB (ref 0.9–3.3)
Lymphocytes Relative: 46 %
MCH: 31.2 pg (ref 25.1–34.0)
MCHC: 33.3 g/dL (ref 31.5–36.0)
MCV: 93.6 fL (ref 79.5–101.0)
MONO ABS: 0.7 10*3/uL (ref 0.1–0.9)
Monocytes Relative: 10 %
NEUTROS ABS: 3.1 10*3/uL (ref 1.5–6.5)
NEUTROS PCT: 41 %
Platelets: 208 10*3/uL (ref 145–400)
RBC: 4.45 MIL/uL (ref 3.70–5.45)
RDW: 12.7 % (ref 11.2–14.5)
WBC: 7.4 10*3/uL (ref 3.9–10.3)

## 2018-01-13 LAB — LACTOFERRIN, FECAL, QUALITATIVE: Lactoferrin, Fecal, Qual: NEGATIVE

## 2018-01-13 NOTE — Telephone Encounter (Signed)
Appt scheduled / per 8/28 los

## 2018-01-19 LAB — FLOW CYTOMETRY

## 2018-02-01 ENCOUNTER — Ambulatory Visit
Admission: RE | Admit: 2018-02-01 | Discharge: 2018-02-01 | Disposition: A | Discharge status: Home / Self Care | Payer: No Typology Code available for payment source | Source: Ambulatory Visit | Attending: Gastroenterology | Admitting: Gastroenterology

## 2018-02-01 DIAGNOSIS — R1031 Right lower quadrant pain: Secondary | ICD-10-CM

## 2018-02-01 MED ORDER — IOPAMIDOL (ISOVUE-300) INJECTION 61%
100.0000 mL | Freq: Once | INTRAVENOUS | Status: AC | PRN
  Administered 2018-02-01: 100 mL via INTRAVENOUS

## 2018-03-29 DIAGNOSIS — D802 Selective deficiency of immunoglobulin A [IgA]: Secondary | ICD-10-CM | POA: Insufficient documentation

## 2018-03-29 DIAGNOSIS — K580 Irritable bowel syndrome with diarrhea: Secondary | ICD-10-CM | POA: Insufficient documentation

## 2018-06-30 ENCOUNTER — Encounter: Payer: Self-pay | Admitting: Hematology

## 2018-06-30 NOTE — Progress Notes (Signed)
Patient's spouse came in upset about billing.  Referred patient to billing department and ask for a supervisor to address his concerns. He verbalized understanding.

## 2019-09-12 DIAGNOSIS — R87619 Unspecified abnormal cytological findings in specimens from cervix uteri: Secondary | ICD-10-CM | POA: Insufficient documentation

## 2020-03-01 IMAGING — CT CT ABD-PELV W/ CM
1 of 3 series · 13 of 32 positions shown, 19 images · IV contrast (APPLIED)
Comparison: 11/05/2012

CLINICAL DATA: RIGHT lower quadrant abdominal pain.

EXAM:
CT ABDOMEN AND PELVIS WITH CONTRAST
TECHNIQUE: Multidetector CT imaging of the abdomen and pelvis was performed
using the standard protocol following bolus administration of
intravenous contrast.
CONTRAST:  100mL PQDFPM-IHH IOPAMIDOL (PQDFPM-IHH) INJECTION 61%

[Series 2: abd/pelvis w/cm · axial · 0.69mm/px · z∈[-449,-89]mm · 13 of 84 slices shown, 19 images]
[im 6/84  soft-tissue]
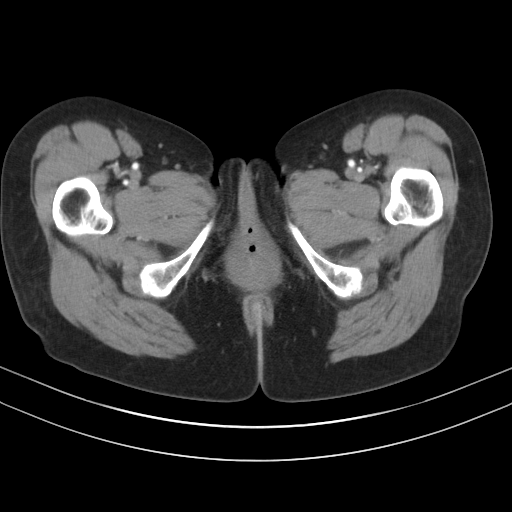
[im 6/84  bone]
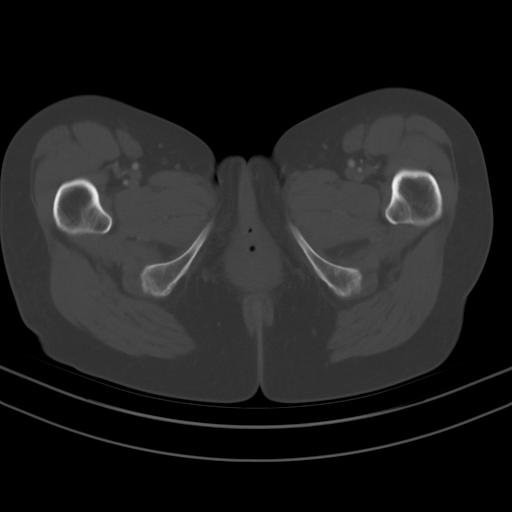
[im 12/84  soft-tissue]
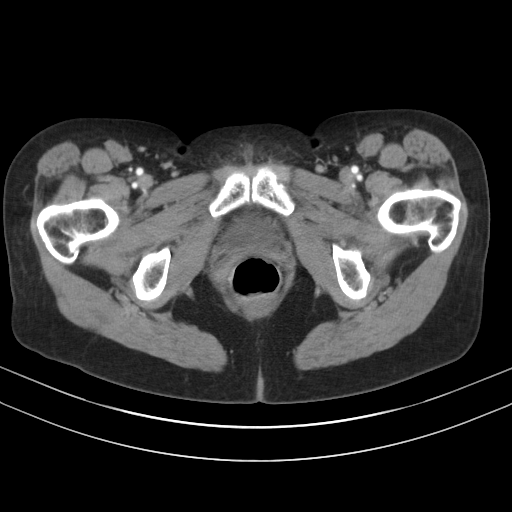
[im 17/84  soft-tissue]
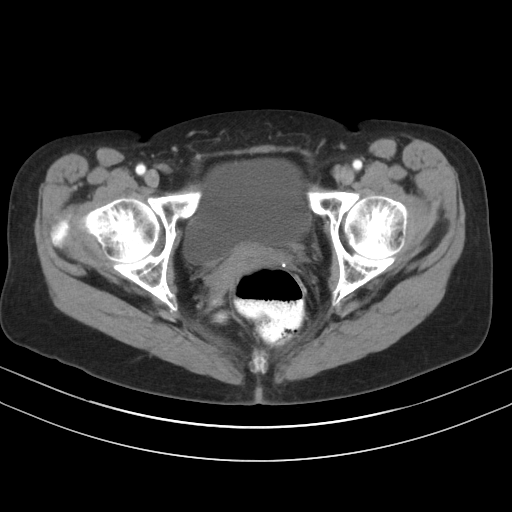
[im 23/84  soft-tissue]
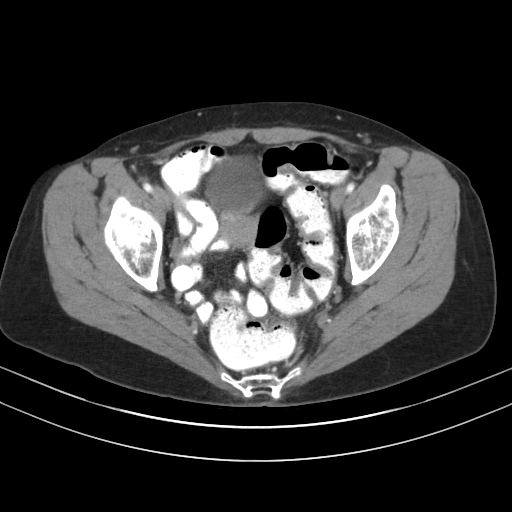
[im 28/84  soft-tissue]
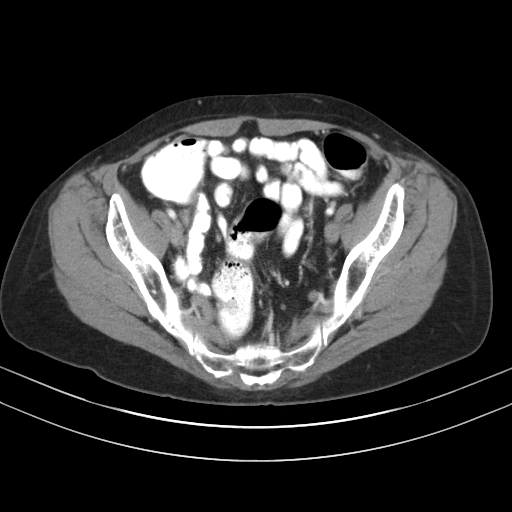
[im 34/84  soft-tissue]
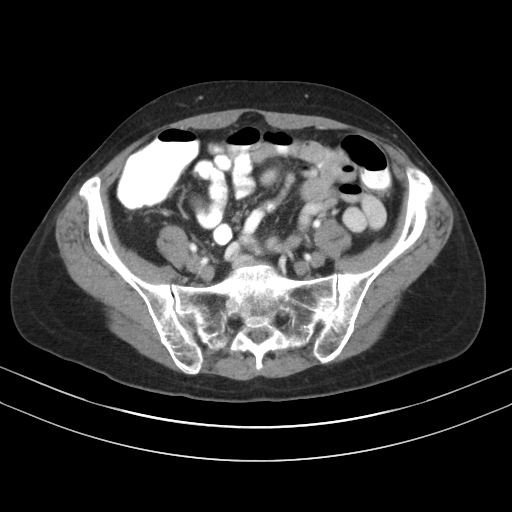
[im 45/84  soft-tissue]
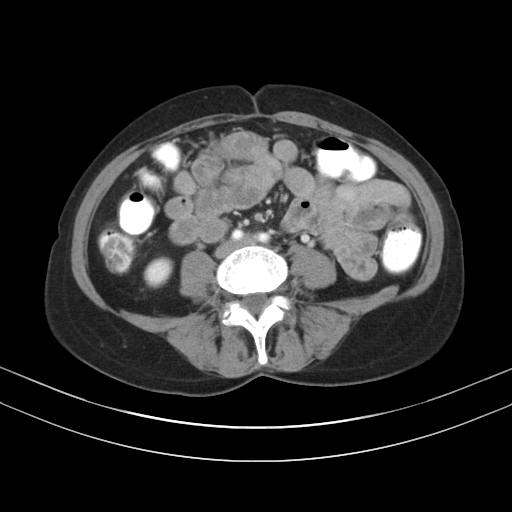
[im 50/84  soft-tissue]
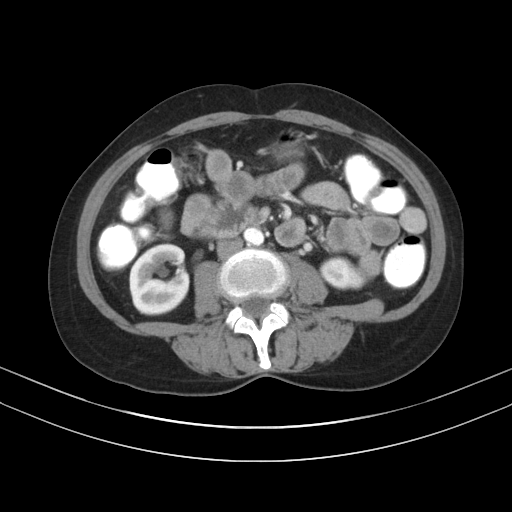
[im 56/84  soft-tissue]
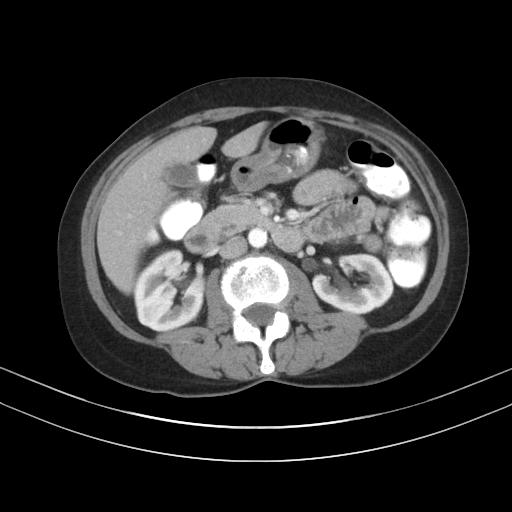
[im 56/84  bone]
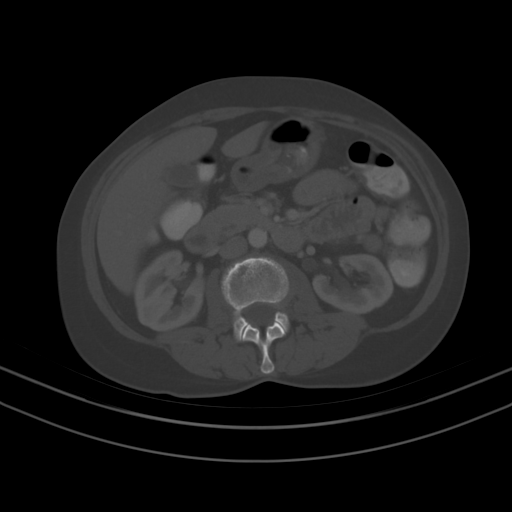
[im 61/84  soft-tissue]
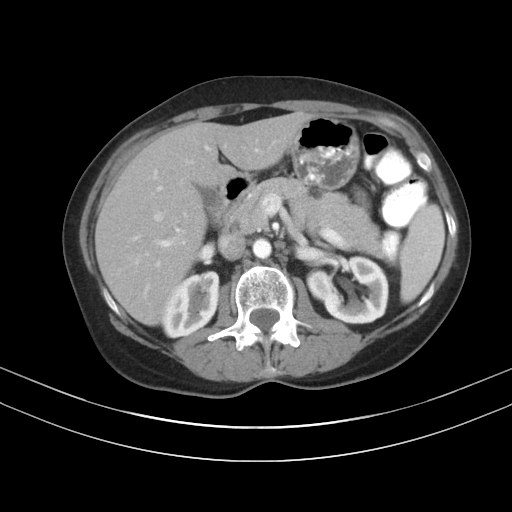
[im 61/84  lung]
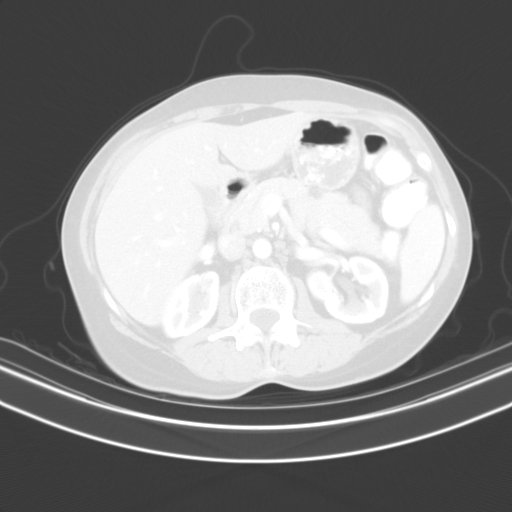
[im 67/84  soft-tissue]
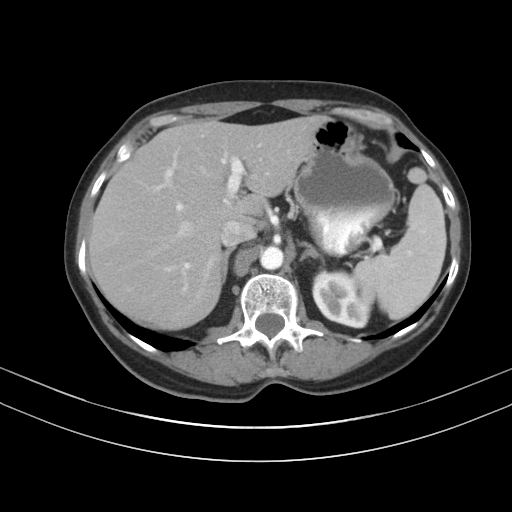
[im 67/84  lung]
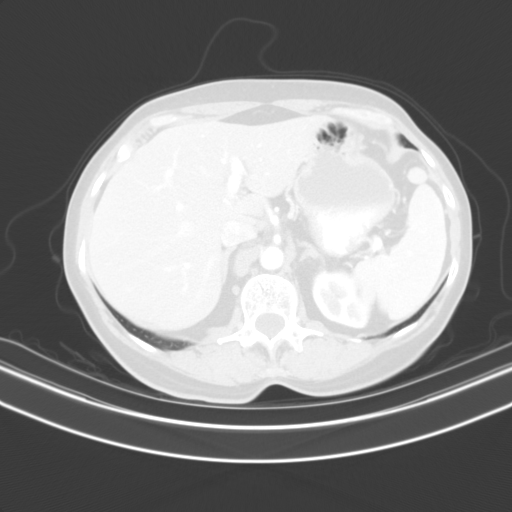
[im 72/84  soft-tissue]
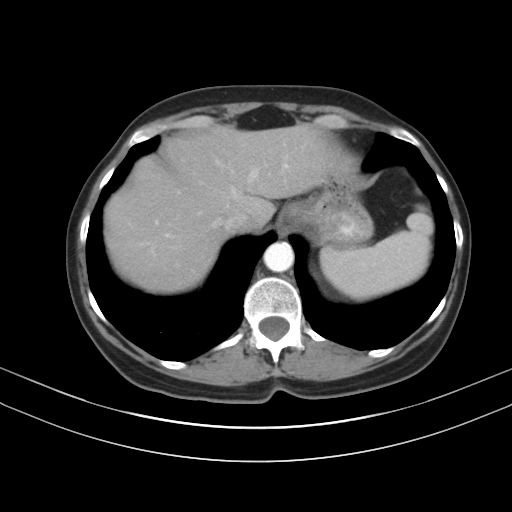
[im 72/84  lung]
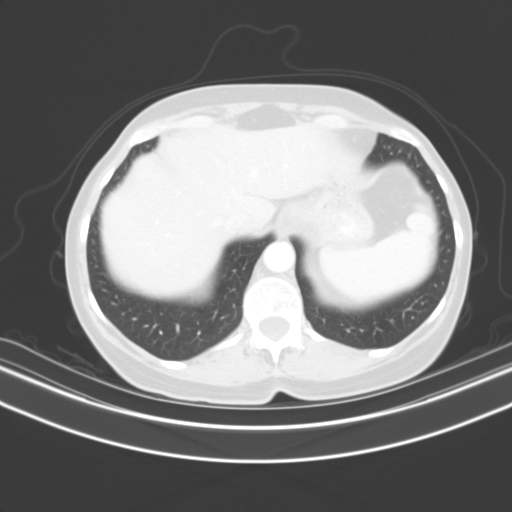
[im 78/84  soft-tissue]
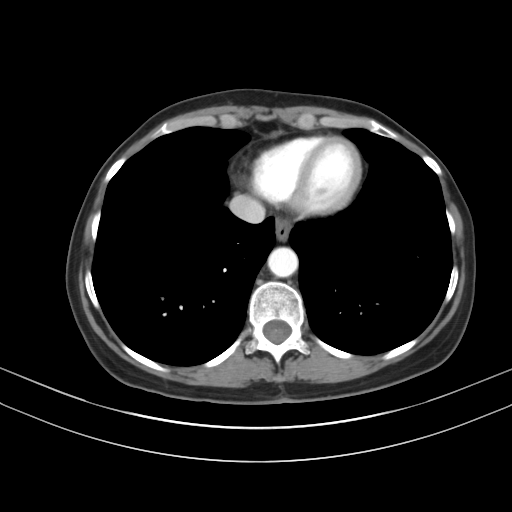
[im 78/84  lung]
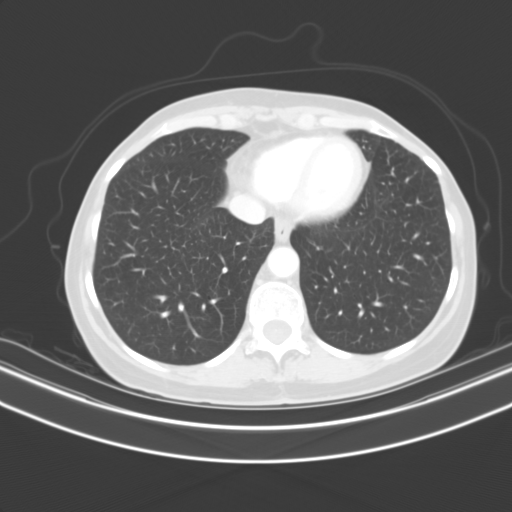

[13 of 32 positions shown; findings below may reference images not displayed]

FINDINGS: Lower chest: Lung bases are clear.

Hepatobiliary: No focal hepatic lesion. No biliary duct dilatation.
Gallbladder is normal. Common bile duct is normal.

Pancreas: Pancreas is normal. No ductal dilatation. No pancreatic
inflammation.

Spleen: Normal spleen

Adrenals/urinary tract: Adrenal glands and kidneys are normal. The
ureters and bladder normal.

Stomach/Bowel: Stomach, small-bowel and cecum are normal. The
appendix is not identified but there is no pericecal inflammation to
suggest appendicitis. The colon and rectosigmoid colon are normal.

Vascular/Lymphatic: Abdominal aorta is normal caliber. No periportal
or retroperitoneal adenopathy. No pelvic adenopathy.

Reproductive: Uterus and ovaries normal.

Other: No free fluid.

Musculoskeletal: No aggressive osseous lesion.
IMPRESSION: 1. No acute abdominopelvic findings.
2. No explanation for RIGHT lower quadrant pain.

## 2020-11-12 ENCOUNTER — Ambulatory Visit (INDEPENDENT_AMBULATORY_CARE_PROVIDER_SITE_OTHER): Payer: No Typology Code available for payment source | Admitting: Otolaryngology

## 2020-12-03 ENCOUNTER — Ambulatory Visit (INDEPENDENT_AMBULATORY_CARE_PROVIDER_SITE_OTHER): Payer: No Typology Code available for payment source | Admitting: Otolaryngology

## 2020-12-03 ENCOUNTER — Other Ambulatory Visit: Payer: Self-pay

## 2020-12-03 DIAGNOSIS — J3489 Other specified disorders of nose and nasal sinuses: Secondary | ICD-10-CM | POA: Diagnosis not present

## 2020-12-03 DIAGNOSIS — H903 Sensorineural hearing loss, bilateral: Secondary | ICD-10-CM | POA: Diagnosis not present

## 2020-12-03 NOTE — Progress Notes (Signed)
HPI: Madison Duncan is a 60 y.o. female who presents for evaluation of head and neck exam as patient has history of HPV positive cancer in her husband and is concerned about this.  She is also told that she might have wax buildup in her ears.  Denies any nasal symptoms.  No past medical history on file. No past surgical history on file. Social History   Socioeconomic History   Marital status: Married    Spouse name: Not on file   Number of children: Not on file   Years of education: Not on file   Highest education level: Not on file  Occupational History   Not on file  Tobacco Use   Smoking status: Never   Smokeless tobacco: Never  Vaping Use   Vaping Use: Never used  Substance and Sexual Activity   Alcohol use: Yes    Alcohol/week: 1.0 standard drink    Types: 1 Glasses of wine per week   Drug use: No   Sexual activity: Not on file  Other Topics Concern   Not on file  Social History Narrative   Not on file   Social Determinants of Health   Financial Resource Strain: Not on file  Food Insecurity: Not on file  Transportation Needs: Not on file  Physical Activity: Not on file  Stress: Not on file  Social Connections: Not on file   No family history on file. No Known Allergies Prior to Admission medications   Medication Sig Start Date End Date Taking? Authorizing Provider  citalopram (CELEXA) 40 MG tablet Take 40 mg by mouth at bedtime.    [provider]     Positive ROS: Otherwise negative  All other systems have been reviewed and were otherwise negative with the exception of those mentioned in the HPI and as above.  Physical Exam: Constitutional: Alert, well-appearing, no acute distress Ears: External ears without lesions or tenderness. Ear canals are clear bilaterally with intact, clear TMs.  On hearing screening with a tuning forks she does have a mild hearing loss with a 1024 tuning fork in both ears which is symmetric. Nasal: External nose without  lesions. Septum with a large anterior septal perforation with minimal crusting around the perforation posteriorly nasal passages otherwise clear with clear middle meatus bilaterally.  No polyps noted..  Oral: Lips and gums without lesions. Tongue and palate mucosa without lesions. Posterior oropharynx clear.  Patient is status post tonsillectomy.  Floor mouth mucosa and buccal mucosa are clear.  Indirect laryngoscopy revealed a clear base of tongue vallecula and epiglottis.  Vocal cords are clear with normal vocal cord mobility. Neck: No palpable adenopathy or masses.  No palpable adenopathy on either side of the neck. Respiratory: Breathing comfortably  Skin: No facial/neck lesions or rash noted.  Procedures  Assessment: Bilateral symmetric sensorineural hearing loss. Septal perforation Clear upper airway examination otherwise.  Plan: Reassured patient of no evidence of upper airway neoplasm. I did review with her that the she has a septal perforation that had not been noted previously but otherwise appears asymptomatic. On clinical exam she has a mild sensorineural hearing loss and might benefit by having audiologic testing in the future if she notices much hearing problems.  Narda Bonds, MD

## 2021-07-08 DIAGNOSIS — K5730 Diverticulosis of large intestine without perforation or abscess without bleeding: Secondary | ICD-10-CM | POA: Insufficient documentation

## 2021-07-08 DIAGNOSIS — Z800 Family history of malignant neoplasm of digestive organs: Secondary | ICD-10-CM | POA: Insufficient documentation

## 2021-09-19 DIAGNOSIS — G4730 Sleep apnea, unspecified: Secondary | ICD-10-CM | POA: Insufficient documentation

## 2023-07-13 DIAGNOSIS — E782 Mixed hyperlipidemia: Secondary | ICD-10-CM | POA: Insufficient documentation

## 2023-07-13 DIAGNOSIS — K5790 Diverticulosis of intestine, part unspecified, without perforation or abscess without bleeding: Secondary | ICD-10-CM | POA: Insufficient documentation

## 2024-03-21 LAB — HM DEXA SCAN

## 2024-04-29 NOTE — Progress Notes (Signed)
° °  LILLETTE Ileana Collet, PhD, LAT, ATC acting as a scribe for Artist Lloyd, MD.  Madison Duncan is a 62 y.o. female who presents to [facility] at [facility] today for osteoporosis management. Pt also c/o chronic neck pain. She works as a armed forces operational officer.  DEXA scan (date, T-score): 03/21/24: Spine= -2.0, L-FN= -2.8, R-FN= -3.1 Prior treatment: Fosamax History of Hip, Spine, or Wrist Fx: L humeral neck fx 2018, Heart disease or stroke: no Cancer: none Kidney Disease: no Gastric/Peptic Ulcer: no Gastric bypass surgery: no Severe GERD: no Hx of seizures: no Age at Menopause: early 50's Calcium intake: yes- multivitamin Vitamin D intake: yes +1000iU Hormone replacement therapy: no Smoking history: tried it in high school  Alcohol: 2 drinks per wk Exercise: strength training exercises, gym x3/wk Major dental work in past year: no Parents with hip/spine fracture: no- mom hx of osteoporosis Height loss: none   Pertinent review of systems: No fevers or  Relevant historical information: Has used Fosamax for 4 years.  She notes muscle pains with this medicine.   Exam:  BP 116/72   Pulse 85   Ht 5' 2 (1.575 m)   Wt 113 lb (51.3 kg)   SpO2 98%   BMI 20.67 kg/m  General: Well Developed, well nourished, and in no acute distress.   MSK: Normal gait    Lab and Radiology Results No results found for this or any previous visit (from the past 72 hours). No results found.     Assessment and Plan: 63 y.o. female with osteoporosis.  Her bone density is worsening despite 4 years of Fosamax and weightbearing exercise including resistance training.  She is also taking vitamin D and some amount of calcium.  We discussed her options.  Plan to check vitamin D and metabolic panel.  Will work on authorization for [employer].  Anticipate 1 year of Tymlos followed by Reclast infusion.  We also talked about maximizing resistance training and recommended osteo strong.   PDMP not  reviewed this encounter. Orders Placed This Encounter  Procedures   VITAMIN D 25 Hydroxy (Vit-D Deficiency, Fractures)    Osteoporis    Standing Status:   Future    Expiration Date:   05/02/2025   Comprehensive metabolic panel with GFR    Standing Status:   Future    Expiration Date:   05/02/2025   No orders of the defined types were placed in this encounter.    Discussed warning signs or symptoms. Please see discharge instructions. Patient expresses understanding.   The above documentation has been reviewed and is accurate and complete Artist Lloyd, M.D.

## 2024-05-02 ENCOUNTER — Ambulatory Visit (INDEPENDENT_AMBULATORY_CARE_PROVIDER_SITE_OTHER): Admitting: Family Medicine

## 2024-05-02 VITALS — BP 116/72 | HR 85 | Ht 62.0 in | Wt 113.0 lb

## 2024-05-02 DIAGNOSIS — M810 Age-related osteoporosis without current pathological fracture: Secondary | ICD-10-CM

## 2024-05-02 NOTE — Patient Instructions (Addendum)
 Thank you for coming in today.   Continue doing strength training  Please get labs today before you leave   We will work to authorize Tymlos in the new year  OsteoStrong

## 2024-05-10 ENCOUNTER — Other Ambulatory Visit (INDEPENDENT_AMBULATORY_CARE_PROVIDER_SITE_OTHER)

## 2024-05-10 DIAGNOSIS — M810 Age-related osteoporosis without current pathological fracture: Secondary | ICD-10-CM

## 2024-05-10 LAB — COMPREHENSIVE METABOLIC PANEL WITH GFR
ALT: 11 U/L (ref 3–35)
AST: 16 U/L (ref 5–37)
Albumin: 4.4 g/dL (ref 3.5–5.2)
Alkaline Phosphatase: 63 U/L (ref 39–117)
BUN: 12 mg/dL (ref 6–23)
CO2: 28 meq/L (ref 19–32)
Calcium: 9.2 mg/dL (ref 8.4–10.5)
Chloride: 99 meq/L (ref 96–112)
Creatinine, Ser: 0.67 mg/dL (ref 0.40–1.20)
GFR: 91.44 mL/min
Glucose, Bld: 80 mg/dL (ref 70–99)
Potassium: 4 meq/L (ref 3.5–5.1)
Sodium: 137 meq/L (ref 135–145)
Total Bilirubin: 0.4 mg/dL (ref 0.2–1.2)
Total Protein: 6.9 g/dL (ref 6.0–8.3)

## 2024-05-10 LAB — VITAMIN D 25 HYDROXY (VIT D DEFICIENCY, FRACTURES): VITD: 35.12 ng/mL (ref 30.00–100.00)

## 2024-05-11 ENCOUNTER — Ambulatory Visit: Payer: Self-pay | Admitting: Family Medicine

## 2024-05-11 NOTE — Progress Notes (Signed)
 Vitamin D  and calcium and metabolic panel look okay.  Continue current dose of vitamin D .  If you wanted to increase it a little bit you could as your level is 35.

## 2024-05-23 ENCOUNTER — Telehealth: Payer: Self-pay | Admitting: Family Medicine

## 2024-05-23 ENCOUNTER — Ambulatory Visit: Admitting: Family Medicine

## 2024-05-23 ENCOUNTER — Ambulatory Visit

## 2024-05-23 VITALS — BP 100/60 | HR 89 | Ht 62.0 in | Wt 114.0 lb

## 2024-05-23 DIAGNOSIS — M542 Cervicalgia: Secondary | ICD-10-CM

## 2024-05-23 MED ORDER — TIZANIDINE HCL 2 MG PO TABS: 2.0000 mg | ORAL_TABLET | Freq: Three times a day (TID) | ORAL | 1 refills | Status: AC | PRN

## 2024-05-23 NOTE — Progress Notes (Signed)
"       ° °  I, Claretha Schimke am a scribe for Dr. Artist Lloyd, MD.  Madison Duncan is a 64 y.o. female who presents to [facility] at [facility] today for neck pain. Pt was previously seen by Dr. Lloyd on 05/02/24 for osteoporosis.  Today, pt c/o neck pain x years. Pt locates pain to whole neck. Patient is a radiographer, therapeutic for 40 years. It hurts every day. She can't turn her head properly without pain.  Radiates: yes to the shoulders UE Numbness/tingling: no UE Weakness: no Aggravates: turning head, sleeping Treatments tried: Advil (over a year ago), massagers   Pertinent review of systems: No fevers or chills  Relevant historical information: Sleep apnea and osteoporosis.   Exam:  BP 100/60   Pulse 89   Ht 5' 2 (1.575 m)   Wt 114 lb (51.7 kg)   SpO2 97%   BMI 20.85 kg/m  General: Well Developed, well nourished, and in no acute distress.   MSK: C-spine normal-appearing decreased cervical motion upper extremity strength is intact.  Tender palpation paraspinal musculature bilateral cervical spine.    Lab and Radiology Results  X-ray images cervical spine obtained today personally and independently interpreted. Multilevel cervical degenerative changes.  No acute fractures are visible.  Worse degenerative changes at C4-5 and C5-6. Await formal radiology review     Assessment and Plan: 64 y.o. female with bilateral neck pain due to cervical degenerative changes and muscle dysfunction.  Plan for physical therapy.  Additionally recommend heating pad TENS unit.  Prescribed tizanidine  to use at bedtime as needed.  Check back if not improved.  Osteoporosis getting started on Tymlos.  Vitamin D  was 35 plan to recheck in about 3 months.   PDMP not reviewed this encounter. Orders Placed This Encounter  Procedures   DG Cervical Spine 2 or 3 views    Standing Status:   Future    Number of Occurrences:   1    Expiration Date:   05/23/2025    Reason for Exam (SYMPTOM  OR  DIAGNOSIS REQUIRED):   eval chronic neck pain    Preferred imaging location?:   [facility]   Ambulatory referral to Physical Therapy    Referral Priority:   Routine    Referral Type:   Physical Medicine    Referral Reason:   Specialty Services Required    Requested Specialty:   Physical Therapy    Number of Visits Requested:   1   Meds ordered this encounter  Medications   tiZANidine  (ZANAFLEX ) 2 MG tablet    Sig: Take 1-2 tablets (2-4 mg total) by mouth every 8 (eight) hours as needed.    Dispense:  60 tablet    Refill:  1     Discussed warning signs or symptoms. Please see discharge instructions. Patient expresses understanding.   The above documentation has been reviewed and is accurate and complete Artist Lloyd, M.D.   "

## 2024-05-23 NOTE — Patient Instructions (Addendum)
 Thank you for coming in today.   Please get an Xray today before you leave   I've referred you to Physical Therapy.  Let us  know if you don't hear from them in one week.   Recheck if not better.   Plan to recheck Vit D in March. Let me know.    TENS UNIT: This is helpful for muscle pain and spasm.   Search and Purchase a TENS 7000 2nd edition at  www.tenspros.com or www.Amazon.com It should be less than $30.     TENS unit instructions: Do not shower or bathe with the unit on Turn the unit off before removing electrodes or batteries If the electrodes lose stickiness add a drop of water to the electrodes after they are disconnected from the unit and place on plastic sheet. If you continued to have difficulty, call the TENS unit company to purchase more electrodes. Do not apply lotion on the skin area prior to use. Make sure the skin is clean and dry as this will help prolong the life of the electrodes. After use, always check skin for unusual red areas, rash or other skin difficulties. If there are any skin problems, does not apply electrodes to the same area. Never remove the electrodes from the unit by pulling the wires. Do not use the TENS unit or electrodes other than as directed. Do not change electrode placement without consultating your therapist or physician. Keep 2 fingers with between each electrode. Wear time ratio is 2:1, on to off times.    For example on for 30 minutes off for 15 minutes and then on for 30 minutes off for 15 minutes

## 2024-05-23 NOTE — Telephone Encounter (Signed)
 Plan to check Vit in 3 months form Dec 15th. Please set a reminder

## 2024-05-26 NOTE — Telephone Encounter (Signed)
 Per reminder, check for prior auth/cost for Tylmlos

## 2024-05-26 NOTE — Telephone Encounter (Signed)
 Prior Authorization initiated for TYMLOS via [email or web address] KEY: [id number]

## 2024-05-27 NOTE — Telephone Encounter (Signed)
 Spoke with patient and advised of approval. She would like to hold off on having rx sent in for now, needs a little more time to think about it. She will call us  back and let us  know what she decides.

## 2024-05-27 NOTE — Telephone Encounter (Signed)
 RX's pending. [facility], need to confirm if pt is agreeable to having RX sent to [facility].   Called pt, left VM to call the office.

## 2024-05-27 NOTE — Addendum Note (Signed)
 Addended by: MARDY LEOTIS RAMAN on: 05/27/2024 12:07 PM   Modules accepted: Orders

## 2024-05-27 NOTE — Telephone Encounter (Signed)
 Prior Authorization for Tymlos 3120MCG/1.56ML pen-injectors APPROVED PA# [id number] Valid: 05/26/24-05/26/26

## 2024-05-31 ENCOUNTER — Other Ambulatory Visit: Payer: Self-pay | Admitting: Family Medicine

## 2024-05-31 ENCOUNTER — Telehealth: Payer: Self-pay | Admitting: Family Medicine

## 2024-05-31 DIAGNOSIS — R911 Solitary pulmonary nodule: Secondary | ICD-10-CM

## 2024-05-31 NOTE — Telephone Encounter (Signed)
 Santasia called back.  We talked about the x-ray and plan to CT scan.  She expresses understanding and agreement.  We also followed up about osteoporosis.  She has done some thinking and would like to start Tymlos.

## 2024-05-31 NOTE — Telephone Encounter (Signed)
 X-ray cervical spine does show a lung nodule that needs follow-up with radiology.  Ordered a CT scan.  I did call the patient.  The second number listed at [phone] is her phone number but the voicemail box is full and I cannot leave a message.  The first number is her husband who I did speak to but did not inform exactly what the findings were.  Will need to try to call the patient again in the future and if we cannot get a hold of her we will have to inform her husband of the CT scan that has been ordered.  This is a low risk x-ray but needs to be evaluated more thoroughly.

## 2024-06-01 ENCOUNTER — Ambulatory Visit: Payer: Self-pay | Admitting: Family Medicine

## 2024-06-01 ENCOUNTER — Inpatient Hospital Stay: Admission: RE | Admit: 2024-06-01 | Discharge: 2024-06-01 | Attending: Family Medicine | Admitting: Family Medicine

## 2024-06-01 DIAGNOSIS — R911 Solitary pulmonary nodule: Secondary | ICD-10-CM

## 2024-06-01 NOTE — Telephone Encounter (Signed)
 Called pt, left VM to call the office.   Need to confirm that pt is agreeable to having Specialty med, Tymlos, sent to [facility].

## 2024-06-01 NOTE — Addendum Note (Signed)
 Addended by: MARDY LEOTIS RAMAN on: 06/01/2024 02:00 PM   Modules accepted: Orders

## 2024-06-01 NOTE — Progress Notes (Signed)
 Cervical spine x-ray shows multilevel arthritis.  I ordered a CT scan to go over the nodule seen on the chest portion on the x-ray.  We talked about this on a phone call yesterday.

## 2024-06-01 NOTE — Telephone Encounter (Signed)
 Per [email or web address] - prior authorization NOT required.  KEY: [id number]

## 2024-06-02 ENCOUNTER — Ambulatory Visit: Payer: Self-pay | Admitting: Family Medicine

## 2024-06-02 DIAGNOSIS — R911 Solitary pulmonary nodule: Secondary | ICD-10-CM

## 2024-06-02 NOTE — Progress Notes (Signed)
 CT scan of the chest shows a lung nodule in the right upper lung measuring 4 x 5 mm.  There is a couple of other teeny nodules as well.  The radiologist recommend following up this CT scan with a another CT scan at 3 to 42-month interval.  Please set a reminder on your calendar to get a CT scan in 4 months.  That would be May 2026.  I will order the CT scan to get done.

## 2024-06-03 ENCOUNTER — Other Ambulatory Visit

## 2024-06-15 ENCOUNTER — Other Ambulatory Visit (HOSPITAL_COMMUNITY): Payer: Self-pay

## 2024-06-15 ENCOUNTER — Other Ambulatory Visit: Payer: Self-pay

## 2024-06-15 MED ORDER — WEBCOL ALCOHOL PREP MEDIUM 70 % PADS
1.0000 | MEDICATED_PAD | Freq: Every day | 3 refills | Status: AC
  Filled 2024-06-15: qty 100, 25d supply, fill #0

## 2024-06-15 MED ORDER — TYMLOS 3120 MCG/1.56ML ~~LOC~~ SOPN
80.0000 ug | PEN_INJECTOR | Freq: Every day | SUBCUTANEOUS | 11 refills | Status: AC
  Filled 2024-06-20: qty 1.57, fill #0

## 2024-06-15 MED ORDER — PEN NEEDLES 31G X 5 MM MISC
1.0000 | Freq: Every day | 3 refills | Status: AC
  Filled 2024-06-15: qty 100, 25d supply, fill #0

## 2024-06-15 MED ORDER — SHARPS CONTAINER MISC
1.0000 | 3 refills | Status: AC | PRN
  Filled 2024-06-15: qty 1, 1d supply, fill #0

## 2024-06-15 NOTE — Addendum Note (Signed)
 Addended by: MARDY LEOTIS RAMAN on: 06/15/2024 11:55 AM   Modules accepted: Orders

## 2024-06-15 NOTE — Telephone Encounter (Signed)
 RX sent to [facility].

## 2024-06-16 ENCOUNTER — Other Ambulatory Visit: Payer: Self-pay

## 2024-06-16 NOTE — Progress Notes (Signed)
 Pharmacy Patient Advocate Encounter  Insurance verification completed.   The patient is insured through [facility]   Ran test claim for Tymlos . Currently a quantity of 1.56 is a 28 day supply and the co-pay is $961.69. Patient is eligible for medicare payment plan  This test claim was processed through [facility]- copay amounts may vary at other pharmacies due to pharmacy/plan contracts, or as the patient moves through the different stages of their insurance plan.

## 2024-06-16 NOTE — Telephone Encounter (Signed)
 More than likely, yes, I think we should plan on setting up the injection training through [facility]. Thank you!

## 2024-06-17 ENCOUNTER — Other Ambulatory Visit: Payer: Self-pay

## 2024-06-20 ENCOUNTER — Other Ambulatory Visit: Payer: Self-pay

## 2024-06-20 NOTE — Telephone Encounter (Signed)
 Patient called the office while we were closed and left a message stating that she does not wish to continue with Tymlos  at this time.

## 2024-06-23 NOTE — Telephone Encounter (Signed)
 Forwarding to Dr. Denyse Amass as Lorain Childes.

## 2024-06-24 ENCOUNTER — Ambulatory Visit: Admitting: Physical Therapy

## 2024-06-24 ENCOUNTER — Encounter: Payer: Self-pay | Admitting: Physical Therapy

## 2024-06-24 ENCOUNTER — Other Ambulatory Visit: Payer: Self-pay

## 2024-06-24 DIAGNOSIS — M542 Cervicalgia: Secondary | ICD-10-CM

## 2024-06-24 DIAGNOSIS — M6281 Muscle weakness (generalized): Secondary | ICD-10-CM

## 2024-06-24 NOTE — Therapy (Signed)
 " OUTPATIENT PHYSICAL THERAPY EVALUATION   Patient Name: Madison Duncan MRN: [id number] DOB:03-23-1961, 64 y.o., female Today's Date: 06/24/2024   END OF SESSION:  PT End of Session - 06/24/24 0922     Visit Number 1    Number of Visits 9    Date for Recertification  08/19/24    Authorization Type BCBS MCR    Progress Note Due on Visit 10    PT Start Time 0930    PT Stop Time 1015    PT Time Calculation (min) 45 min    Activity Tolerance Patient tolerated treatment well    Behavior During Therapy [facility] for tasks assessed/performed          History reviewed. No pertinent past medical history. History reviewed. No pertinent surgical history. 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   Irritable bowel syndrome with diarrhea 03/29/2018   Closed fracture of left proximal humerus 09/16/2016   Age-related osteoporosis without current pathological fracture 12/03/2015   Allergic rhinitis 09/25/2014   OSA on CPAP 09/25/2014   Fatigue 07/24/2014   Hypersomnia 07/24/2014   Snoring 07/24/2014   Depression 08/15/2013   Psoriasis 03/09/2012    PCP: Jacques Camie Pepper, PA-C   REFERRING PROVIDER: Joane Artist RAMAN, MD  REFERRING DIAG: Neck pain  THERAPY DIAG:  Cervicalgia  Muscle weakness (generalized)  Rationale for Evaluation and Treatment: Rehabilitation  ONSET DATE: Chronic   SUBJECTIVE:       SUBJECTIVE STATEMENT: Patient reports neck pain for years, 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 She states the right side seems to be more than the left. She does go to the gym every day to do something and that has helped a lot. She  feels like she has limited movement and pain turning her neck. She also reports when she turns her head too many times, like when driving, then that can contribute to headaches. She reports she can hear bone like sounds and it feels strained all the time. She has used heat and massage device that seems to help but it is temporary. She denies taking any medication for the pain. She denies any referral into the shoulders or arms.   PERTINENT HISTORY:  See PMH above  PAIN:  Are you having pain? Yes:  NPRS scale: 2/10 currently, 5-6/10 at worst Pain location: Neck (R > L) Pain description: Strained Aggravating factors: Neck movement, lifting heavy objects Relieving factors: Heat, massage  PRECAUTIONS: None  RED FLAGS: None    WEIGHT BEARING RESTRICTIONS: No  FALLS:  Has patient fallen in last 6 months? No  PLOF: Independent  PATIENT GOALS: Be able to turn head without pain or onset of headache, reduce daily pain   OBJECTIVE:  Note: Objective measures were completed at Evaluation unless otherwise noted. PATIENT SURVEYS:  PSFS: 2.4 Driving and turning neck to check for cars: 2 Lifting: 2 When doing yoga (pain during stretching): 3 Certain exercises I've learning not to to: 0 Leaning head down while filling out this chart: 5  COGNITION: Overall cognitive status: Within functional limits for tasks assessed  SENSATION: WFL  POSTURE:   Slight rounded shoulder and forward head posture  PALPATION: Tender  to palpation bilateral (R > L) suboccipitals, cervical paraspinals and upper trap/levator region   Patient reports stiffness/stretching and feeling of relief with cervical and thoracic CPA, cervical side glide, and cervical traction  CERVICAL ROM:   Active ROM A/PROM (deg) eval  Flexion 40*  Extension 35  Right lateral flexion 15  Left lateral flexion 10*  Right rotation 45*  Left rotation 50   (Blank rows = not tested)  Patient reports some level of strain  with all movements but worse with flexion, left side bend, and right rotation  UPPER EXTREMITY ROM: Patient demonstrates limitations with shoulder elevation bilaterally, able to perform functional ER and IR reaches behind back  UPPER EXTREMITY MMT:  MMT Right eval Left eval  Shoulder flexion 5 5  Shoulder extension 5 5  Shoulder abduction 5 5  Shoulder adduction    Shoulder extension    Shoulder internal rotation 5 5  Shoulder external rotation 5 5  Middle trapezius 3+ 3+  Lower trapezius 3 3  Elbow flexion    Elbow extension    Wrist flexion    Wrist extension    Wrist ulnar deviation    Wrist radial deviation    Wrist pronation    Wrist supination    Grip strength     (Blank rows = not tested)  FUNCTIONAL TESTS:  DNF endurance: 4 seconds   TREATMENT  [facility] Adult PT Treatment:                                                DATE: 06/24/2024 Prone scapular retraction Seated cervical rotational SNAG with towel Seated cervical retraction with finger assist at chin  Discussed etiology of current symptoms likely due to cervical mobility deficits, postural dysfunction leading to increased cervical muscular tension and reduce strength/endurance.  PATIENT EDUCATION:  Education details: Exam findings, POC, HEP Person educated: Patient Education method: Explanation, Demonstration, Tactile cues, Verbal cues, and Handouts Education comprehension: verbalized understanding, returned demonstration, verbal cues required, tactile cues required, and needs further education  HOME EXERCISE PROGRAM: Access Code: [email or web address]   ASSESSMENT: CLINICAL IMPRESSION: Patient is a 64 y.o. female who was seen today for physical therapy evaluation and treatment for chronic neck pain. Her symptoms seem consistent with mobility deficits of the cervical and thoracic spine as well as strength and coordination deficits with postural dysfunction likely contributing to her neck pain and onset of headaches.  She does report benefit with cervical manual techniques and following exercises.  OBJECTIVE IMPAIRMENTS: decreased activity tolerance, decreased endurance, decreased ROM, decreased strength, postural dysfunction, and pain.   ACTIVITY LIMITATIONS: carrying and lifting  PARTICIPATION LIMITATIONS: driving  PERSONAL FACTORS: Past/current experiences and Time since onset of injury/illness/exacerbation are also affecting patient's functional outcome.   REHAB POTENTIAL: Good  CLINICAL DECISION MAKING: Stable/uncomplicated  EVALUATION COMPLEXITY: Low   GOALS: Goals reviewed with patient? Yes  SHORT TERM GOALS: Target date: 07/22/2024  Patient will be I with initial HEP in order to progress with therapy. Baseline: HEP provided at eval Goal status: INITIAL  2.  Patient will report neck pain </= 2/10 with all activity in order to reduce functional limitations Baseline: 5-6/10 pain at worst Goal status: INITIAL  LONG TERM GOALS: Target date: 08/19/2024  Patient will be I with final HEP to maintain progress from PT. Baseline: HEP provided at eval Goal status: INITIAL  2.  Patient will report PSFS >/= 6 in order to indicate improvement in their functional ability. Baseline: 2.4 Goal status: INITIAL  3.  Patient will demonstrate cervical rotation >/= 60 deg bilaterally without increased pain or headache in order to improve driving Baseline: see limitations above Goal status: INITIAL  4.  Patient will demonstrate DNF endurance >/= 20 seconds to improve cervical postural control and reduce persistent neck pain Baseline: 4 seconds Goal status: INITIAL   PLAN: PT FREQUENCY: 1x/week  PT DURATION: 8 weeks  PLANNED INTERVENTIONS: 97164- PT Re-evaluation, 97750- Physical Performance Testing, 97110-Therapeutic exercises, 97530- Therapeutic activity, 97112- Neuromuscular re-education, 97535- Self Care, 02859- Manual therapy, 20560 (1-2 muscles), 20561 (3+ muscles)- Dry Needling,  Patient/Family education, Taping, Joint mobilization, Joint manipulation, Spinal manipulation, Spinal mobilization, Cryotherapy, and Moist heat  PLAN FOR NEXT SESSION: Review HEP and progress PRN, manual/TPDN for cervical and thoracic region, stretching and mobility exercises for the cervical and thoracic spine, progress DNF training, postural strengthening and control   Elaine Daring, PT, DPT, LAT, ATC 06/24/24  11:51 AM Phone: [phone] Fax: [phone]       "

## 2024-06-24 NOTE — Patient Instructions (Signed)
 Access Code: [email or web address] URL: [email or web address] Date: 06/24/2024 Prepared by: Elaine Daring  Exercises - Prone Scapular Retraction  - 2 x daily - 10 reps - 5 seconds hold - Seated Assisted Cervical Rotation with Towel  - 2 x daily - 10 reps - Seated Passive Cervical Retraction  - 3-5 x daily - 10 reps - 5 seconds hold

## 2024-06-29 ENCOUNTER — Encounter: Admitting: Physical Therapy

## 2024-07-07 ENCOUNTER — Encounter: Admitting: Physical Therapy

## 2024-07-12 ENCOUNTER — Encounter: Admitting: Physical Therapy

## 2024-07-20 ENCOUNTER — Encounter: Admitting: Physical Therapy

## 2024-08-31 ENCOUNTER — Other Ambulatory Visit
